# Patient Record
Sex: Male | Born: 1960 | Race: White | Hispanic: Yes | Smoking: Never smoker
Health system: Southern US, Community
[De-identification: ages and names within clinical notes are randomized; demographics above are authoritative.]

## PROBLEM LIST (undated history)

## (undated) HISTORY — PX: PARTIAL HIP ARTHROPLASTY: SHX733

---

## 2004-02-08 ENCOUNTER — Other Ambulatory Visit: Payer: Self-pay

## 2007-02-06 ENCOUNTER — Emergency Department: Payer: Self-pay | Admitting: Emergency Medicine

## 2008-01-08 ENCOUNTER — Emergency Department: Payer: Self-pay

## 2008-01-26 ENCOUNTER — Ambulatory Visit: Payer: Self-pay | Admitting: Internal Medicine

## 2008-02-02 ENCOUNTER — Ambulatory Visit: Payer: Self-pay | Admitting: Orthopedic Surgery

## 2008-02-21 ENCOUNTER — Ambulatory Visit: Payer: Self-pay | Admitting: Orthopedic Surgery

## 2008-03-02 ENCOUNTER — Inpatient Hospital Stay: Payer: Self-pay | Admitting: Orthopedic Surgery

## 2008-05-20 ENCOUNTER — Emergency Department: Payer: Self-pay | Admitting: Emergency Medicine

## 2013-08-16 ENCOUNTER — Emergency Department: Payer: Self-pay | Admitting: Emergency Medicine

## 2017-03-26 ENCOUNTER — Emergency Department
Admission: EM | Admit: 2017-03-26 | Discharge: 2017-03-26 | Disposition: A | Discharge status: Home / Self Care | Payer: PRIVATE HEALTH INSURANCE | Attending: Emergency Medicine | Admitting: Emergency Medicine

## 2017-03-26 ENCOUNTER — Emergency Department: Payer: PRIVATE HEALTH INSURANCE

## 2017-03-26 DIAGNOSIS — Y9389 Activity, other specified: Secondary | ICD-10-CM | POA: Insufficient documentation

## 2017-03-26 DIAGNOSIS — S50352A Superficial foreign body of left elbow, initial encounter: Secondary | ICD-10-CM | POA: Diagnosis present

## 2017-03-26 DIAGNOSIS — W458XXA Other foreign body or object entering through skin, initial encounter: Secondary | ICD-10-CM | POA: Diagnosis not present

## 2017-03-26 DIAGNOSIS — L03114 Cellulitis of left upper limb: Secondary | ICD-10-CM | POA: Diagnosis not present

## 2017-03-26 DIAGNOSIS — S50852A Superficial foreign body of left forearm, initial encounter: Secondary | ICD-10-CM | POA: Diagnosis not present

## 2017-03-26 DIAGNOSIS — Y929 Unspecified place or not applicable: Secondary | ICD-10-CM | POA: Insufficient documentation

## 2017-03-26 DIAGNOSIS — Y998 Other external cause status: Secondary | ICD-10-CM | POA: Diagnosis not present

## 2017-03-26 DIAGNOSIS — L089 Local infection of the skin and subcutaneous tissue, unspecified: Secondary | ICD-10-CM | POA: Diagnosis not present

## 2017-03-26 LAB — CBC WITH DIFFERENTIAL/PLATELET
BASOS ABS: 0.1 10*3/uL (ref 0–0.1)
BASOS PCT: 2 %
EOS ABS: 0 10*3/uL (ref 0–0.7)
Eosinophils Relative: 0 %
HEMATOCRIT: 44.4 % (ref 40.0–52.0)
Hemoglobin: 15.2 g/dL (ref 13.0–18.0)
Lymphocytes Relative: 38 %
Lymphs Abs: 2.5 10*3/uL (ref 1.0–3.6)
MCH: 31.8 pg (ref 26.0–34.0)
MCHC: 34.3 g/dL (ref 32.0–36.0)
MCV: 92.9 fL (ref 80.0–100.0)
MONO ABS: 0.7 10*3/uL (ref 0.2–1.0)
Monocytes Relative: 11 %
NEUTROS ABS: 3.1 10*3/uL (ref 1.4–6.5)
NEUTROS PCT: 49 %
PLATELETS: 245 10*3/uL (ref 150–440)
RBC: 4.78 MIL/uL (ref 4.40–5.90)
RDW: 12.9 % (ref 11.5–14.5)
WBC: 6.4 10*3/uL (ref 3.8–10.6)

## 2017-03-26 LAB — BASIC METABOLIC PANEL
ANION GAP: 7 (ref 5–15)
BUN: 12 mg/dL (ref 6–20)
CALCIUM: 9.1 mg/dL (ref 8.9–10.3)
CO2: 26 mmol/L (ref 22–32)
Chloride: 105 mmol/L (ref 101–111)
Creatinine, Ser: 0.85 mg/dL (ref 0.61–1.24)
GLUCOSE: 92 mg/dL (ref 65–99)
Potassium: 3.8 mmol/L (ref 3.5–5.1)
Sodium: 138 mmol/L (ref 135–145)

## 2017-03-26 MED ORDER — HYDROCODONE-ACETAMINOPHEN 5-325 MG PO TABS
1.0000 | ORAL_TABLET | Freq: Once | ORAL | Status: AC
  Administered 2017-03-26: 1 via ORAL
  Filled 2017-03-26: qty 1

## 2017-03-26 MED ORDER — CLINDAMYCIN PHOSPHATE 600 MG/50ML IV SOLN
600.0000 mg | Freq: Once | INTRAVENOUS | Status: AC
  Administered 2017-03-26: 600 mg via INTRAVENOUS
  Filled 2017-03-26: qty 50

## 2017-03-26 MED ORDER — KETOROLAC TROMETHAMINE 30 MG/ML IJ SOLN
30.0000 mg | Freq: Once | INTRAMUSCULAR | Status: AC
  Administered 2017-03-26: 30 mg via INTRAVENOUS
  Filled 2017-03-26: qty 1

## 2017-03-26 MED ORDER — HYDROCODONE-ACETAMINOPHEN 5-325 MG PO TABS: 1.0000 | ORAL_TABLET | Freq: Three times a day (TID) | ORAL | 0 refills | Status: DC | PRN

## 2017-03-26 MED ORDER — KETOROLAC TROMETHAMINE 10 MG PO TABS: 10.0000 mg | ORAL_TABLET | Freq: Three times a day (TID) | ORAL | 0 refills | Status: DC

## 2017-03-26 MED ORDER — CLINDAMYCIN HCL 300 MG PO CAPS: 300.0000 mg | ORAL_CAPSULE | Freq: Three times a day (TID) | ORAL | 0 refills | Status: AC

## 2017-03-26 MED ORDER — IBUPROFEN 800 MG PO TABS: 800.0000 mg | ORAL_TABLET | Freq: Once | ORAL | Status: DC

## 2017-03-26 NOTE — Discharge Instructions (Signed)
Keep the wound clean, dry, and covered. Follow-up with the company's urgent care center on Sunday, as scheduled. Return to the ED for signs of worsening infection. Take the antibiotic as directed. Take the anti-inflammatory pain medicine daily as directed. Take the pain medicine as needed.

## 2017-03-26 NOTE — ED Provider Notes (Signed)
[facility] Emergency Department Provider Note ____________________________________________  Time seen: 1456  I have reviewed the triage vital signs and the nursing notes.  HISTORY  Chief Complaint  Foreign Body  HPI Jimmy Homewood Sr. is a 56 y.o. male Presents to the ED from a local urgent care, after the attempt to remove a retained metallic foreign body from his left elbow. Patient scribes that a week ago he was using a dominant blade to cut some cast iron types. He describes [employer] officer flying and cutting his forearms. He describes a large piece apparently impaled his forearm. Since that time he's noted increased swelling and redness to his left forearm. He presented to a local urgent care today, for evaluation and management. They apparently attempted to remove the suspected metallic foreign body. He presents with a large surgical wound to the proximal forearm. He notes they were unable to remove any foreign body material. He notes his last tetanus was 5 year prior.   History reviewed. No pertinent past medical history.  There are no active problems to display for this patient.  Past Surgical History:  Procedure Laterality Date  . PARTIAL HIP ARTHROPLASTY Left     Prior to Admission medications   Medication Sig Start Date End Date Taking? Authorizing Provider  Omega-3 Fatty Acids (FISH OIL) 1000 MG CAPS Take by mouth.   Yes [provider]  clindamycin (CLEOCIN) 300 MG capsule Take 1 capsule (300 mg total) by mouth 3 (three) times daily. 03/26/17 04/05/17  Gershom Brobeck, Charlesetta Ivory, PA-C  HYDROcodone-acetaminophen (NORCO) 5-325 MG tablet Take 1 tablet by mouth 3 (three) times daily as needed. 03/26/17   Kobe Ofallon, Charlesetta Ivory, PA-C  ketorolac (TORADOL) 10 MG tablet Take 1 tablet (10 mg total) by mouth every 8 (eight) hours. 03/26/17   Aran Menning, Charlesetta Ivory, PA-C   Allergies Patient has no known allergies.  No family history on  file.  Social History Social History  Substance Use Topics  . Smoking status: Never Smoker  . Smokeless tobacco: Never Used  . Alcohol use Yes    Review of Systems  Constitutional: Negative for fever. Cardiovascular: Negative for chest pain. Respiratory: Negative for shortness of breath. Musculoskeletal: Negative for back pain. LUE pain & swelling as above  Skin: Negative for rash. Neurological: Negative for headaches, focal weakness or numbness. ____________________________________________  PHYSICAL EXAM:  VITAL SIGNS: ED Triage Vitals  Enc Vitals Group     BP 03/26/17 1411 (!) 162/80     Pulse Rate 03/26/17 1411 65     Resp 03/26/17 1411 16     Temp 03/26/17 1411 (!) 97.5 F (36.4 C)     Temp Source 03/26/17 1411 Oral     SpO2 03/26/17 1411 98 %     Weight 03/26/17 1412 185 lb (83.9 kg)     Height 03/26/17 1412  (1.676 m)     Head Circumference --      Peak Flow --      Pain Score 03/26/17 1411 10     Pain Loc --      Pain Edu? --      Excl. in GC? --     Constitutional: Alert and oriented. Well appearing and in no distress. Head: Normocephalic and atraumatic. Cardiovascular: Normal rate, regular rhythm. Normal distal pulses. Respiratory: Normal respiratory effort. No wheezes/rales/rhonchi. Musculoskeletal: Normal composite fist. Nontender with normal range of motion in all extremities.  Neurologic:  Normal gross sensation. Normal intrinsic &  opposition testing. No gross focal neurologic deficits are appreciated. Skin:  Skin is warm, dry and intact. No rash noted. LUE with local erythema and edema extending distally from the 4 cm stellate laceration at the elbow.  ____________________________________________   LABS (pertinent positives/negatives)  Labs Reviewed  BASIC METABOLIC PANEL  CBC WITH DIFFERENTIAL/PLATELET  ____________________________________________   RADIOLOGY  Left Elbow  IMPRESSION: No evidence of metallic density foreign  object  I, Akosua Constantine, Charlesetta Ivory, personally viewed and evaluated these images (plain radiographs) as part of my medical decision making, as well as reviewing the written report by the radiologist. ____________________________________________  PROCEDURES  Toradol 30 mg IVP Clindamycin 600 mg IVPB Norco 5-325 mg PO  LACERATION REPAIR Performed by: Lauro Regulus, PA-S ([place])  Authorized by: Lissa Hoard Consent: Verbal consent obtained. Risks and benefits: risks, benefits and alternatives were discussed Consent given by: patient Patient identity confirmed: provided demographic data Prepped and Draped in normal sterile fashion Wound explored  Laceration Location: left elbow/forearm  Laceration Length: 4 cm  No Foreign Bodies seen or palpated  Irrigation method: syringe w/ saline Amount of cleaning: copious saline flush  Skin closure: Steri-strips  Patient tolerance: Patient tolerated the procedure well with no immediate complications. ____________________________________________  INITIAL IMPRESSION / ASSESSMENT AND PLAN / ED COURSE  Patient with the ED evaluation of a left forearm cellulitis following a laceration created by a metallic foreign body, 1 week prior. Patient was seen today at the local urgent care, an attempt was made to remove a subcutaneous foreign body seen on x-ray. Repeat of the x-rays here in the ED does not reveal a metallic foreign body or radiopaque foreign body in the forearm. The patient is treated for a cellulitis to the left forearm and an IV dose of clindamycin is for infection. He'll be discharged with clindamycin tabs to dose as directed, Toradol for pain, and hydrocodone for more moderate to severe pain. Wound care instructions are provided and he will follow up with ortho in 3-5 days. Return precautions are reviewed with the patient. He is set to see the company's urgent care center on Sunday, for wound  check. ____________________________________________  FINAL CLINICAL IMPRESSION(S) / ED DIAGNOSES  Final diagnoses:  Cellulitis of left upper extremity  Foreign body in forearm-infected, left, initial encounter      Lissa Hoard, PA-C 03/26/17 1806    Rockne Menghini, MD 03/26/17 2349

## 2017-03-26 NOTE — ED Notes (Addendum)
See triage note  Presents with a piece of metal in left arm  States he was working and cutting metal a piece went into arm  Was sent from [facility]   Per pt they tried to remove it w/o success  Left f/a is swollen and tender to touch  States this happened about 2-3 days ago

## 2017-03-26 NOTE — ED Triage Notes (Signed)
Pt sent from urgent care with c/o a piece of metal in the left elbow from cutting metal pipes today. States they tried for 2 hrs to remove the foreign body without any relief.Marland Kitchen

## 2018-04-11 IMAGING — DX DG ELBOW COMPLETE 3+V*L*
4 series · 4 of 4 positions shown · non-contrast
Comparison: None.

CLINICAL DATA: Metallic foreign object after metal cutting injury.

EXAM:
LEFT ELBOW - COMPLETE 3+ VIEW

[elbow ap]
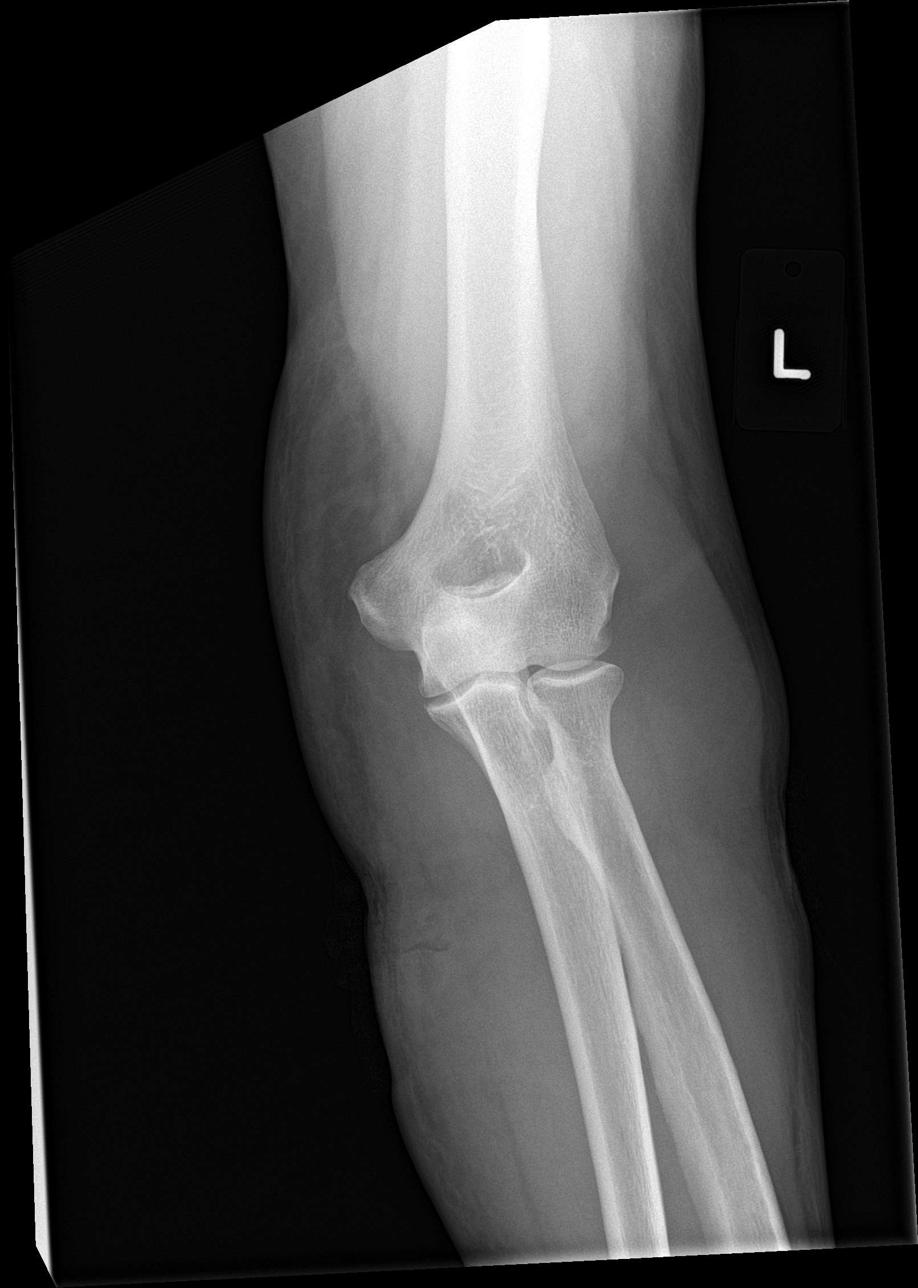

[elbow obl (1 of 2)]
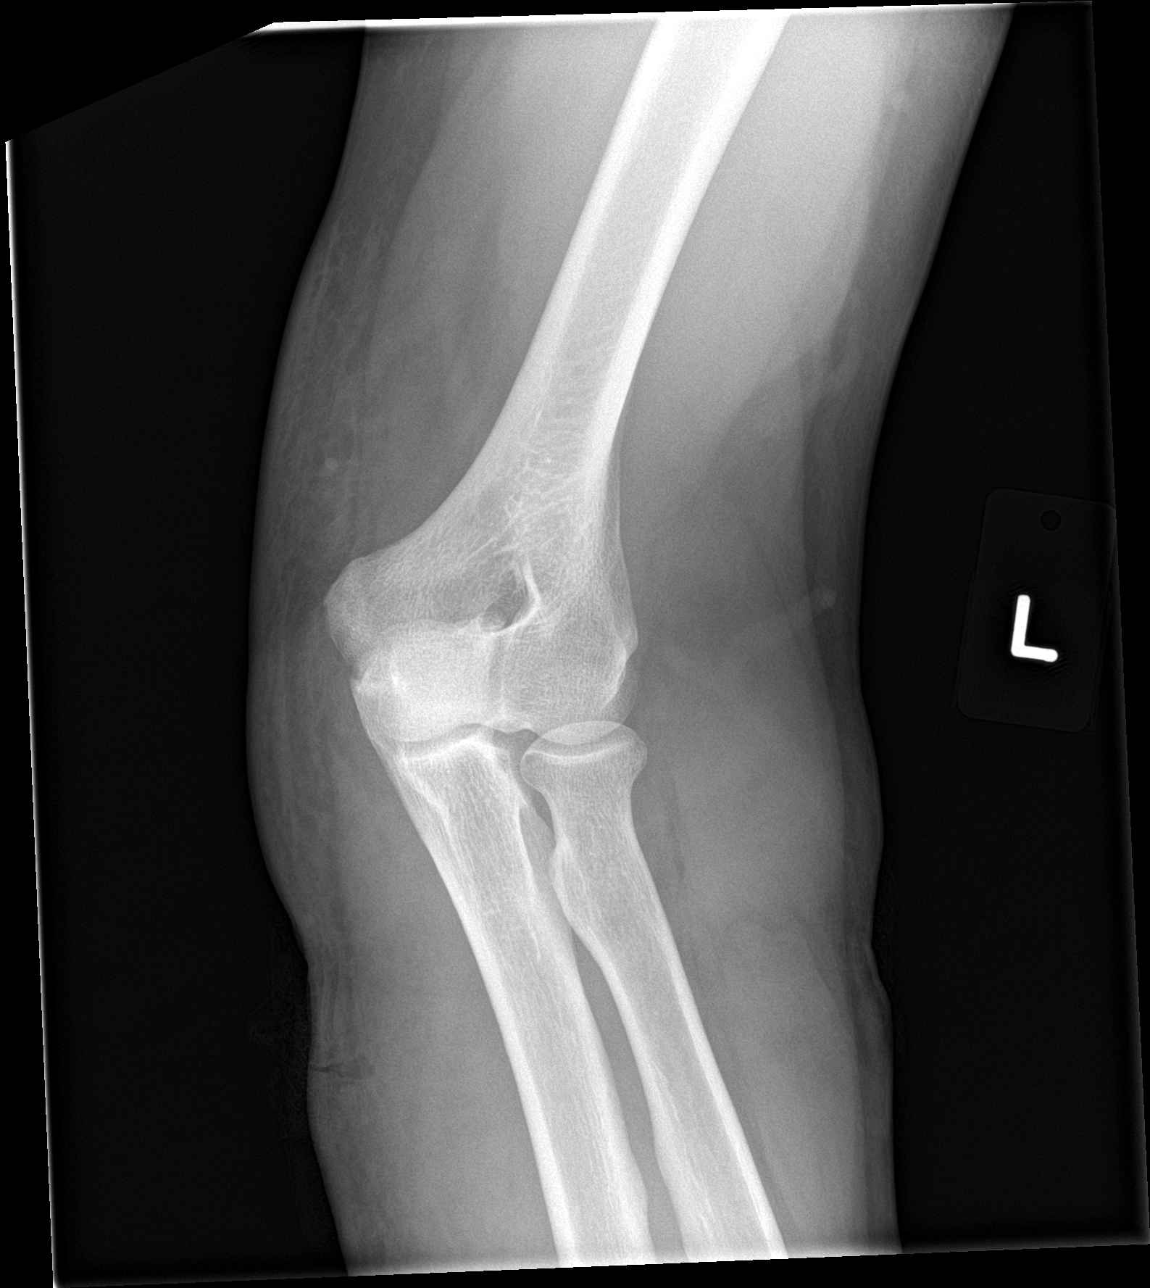

[elbow obl (2 of 2)]
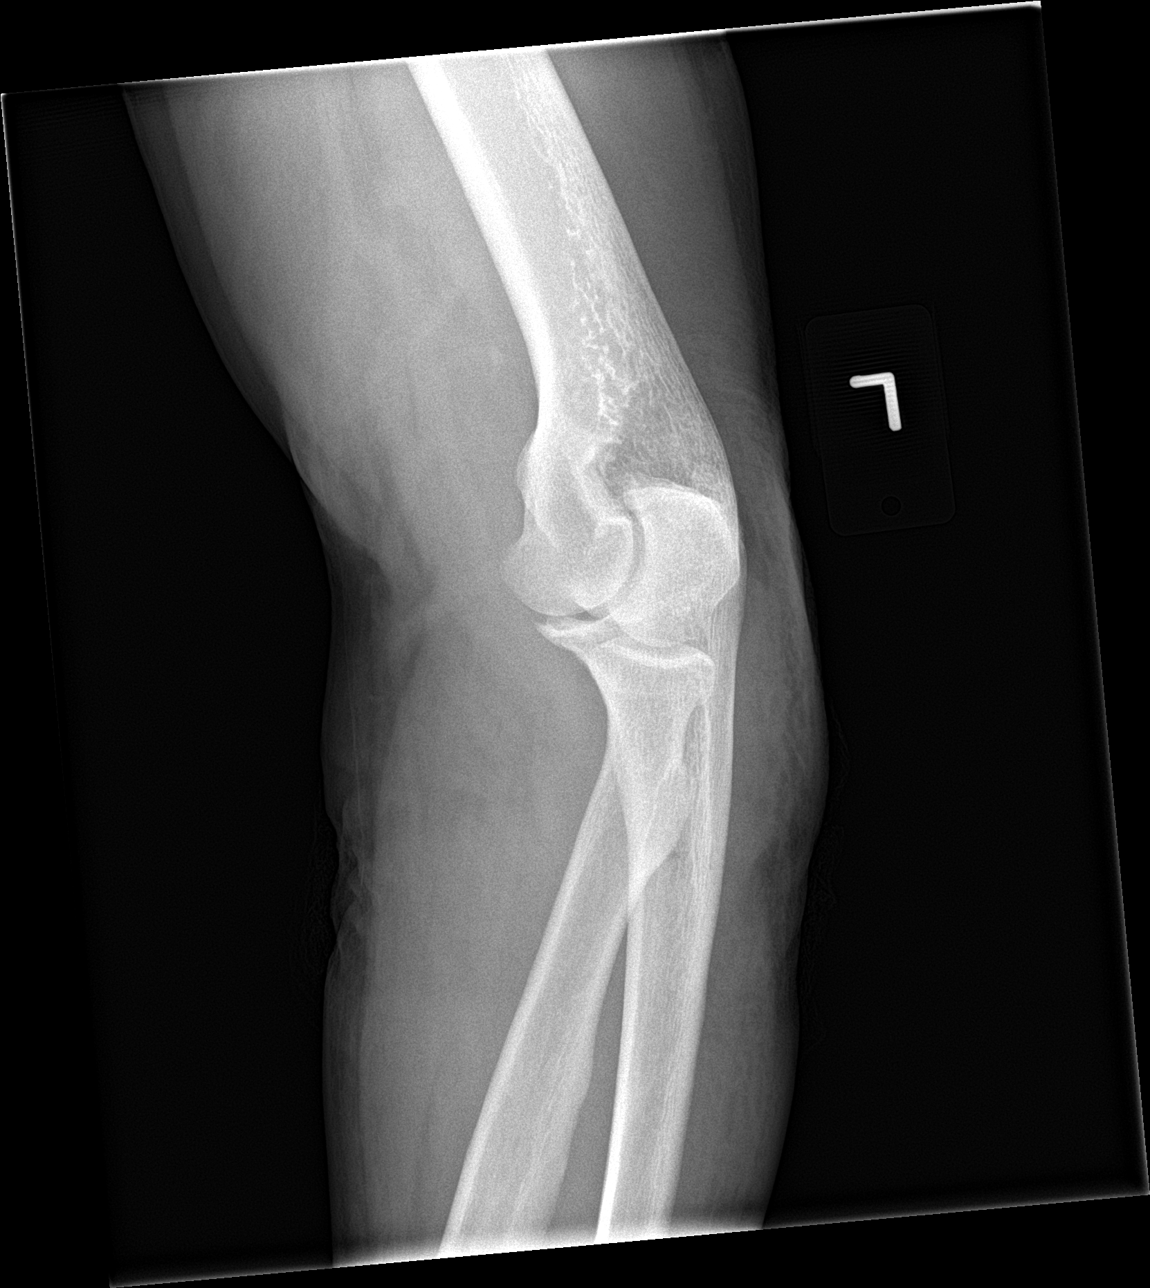

[elbow lat]
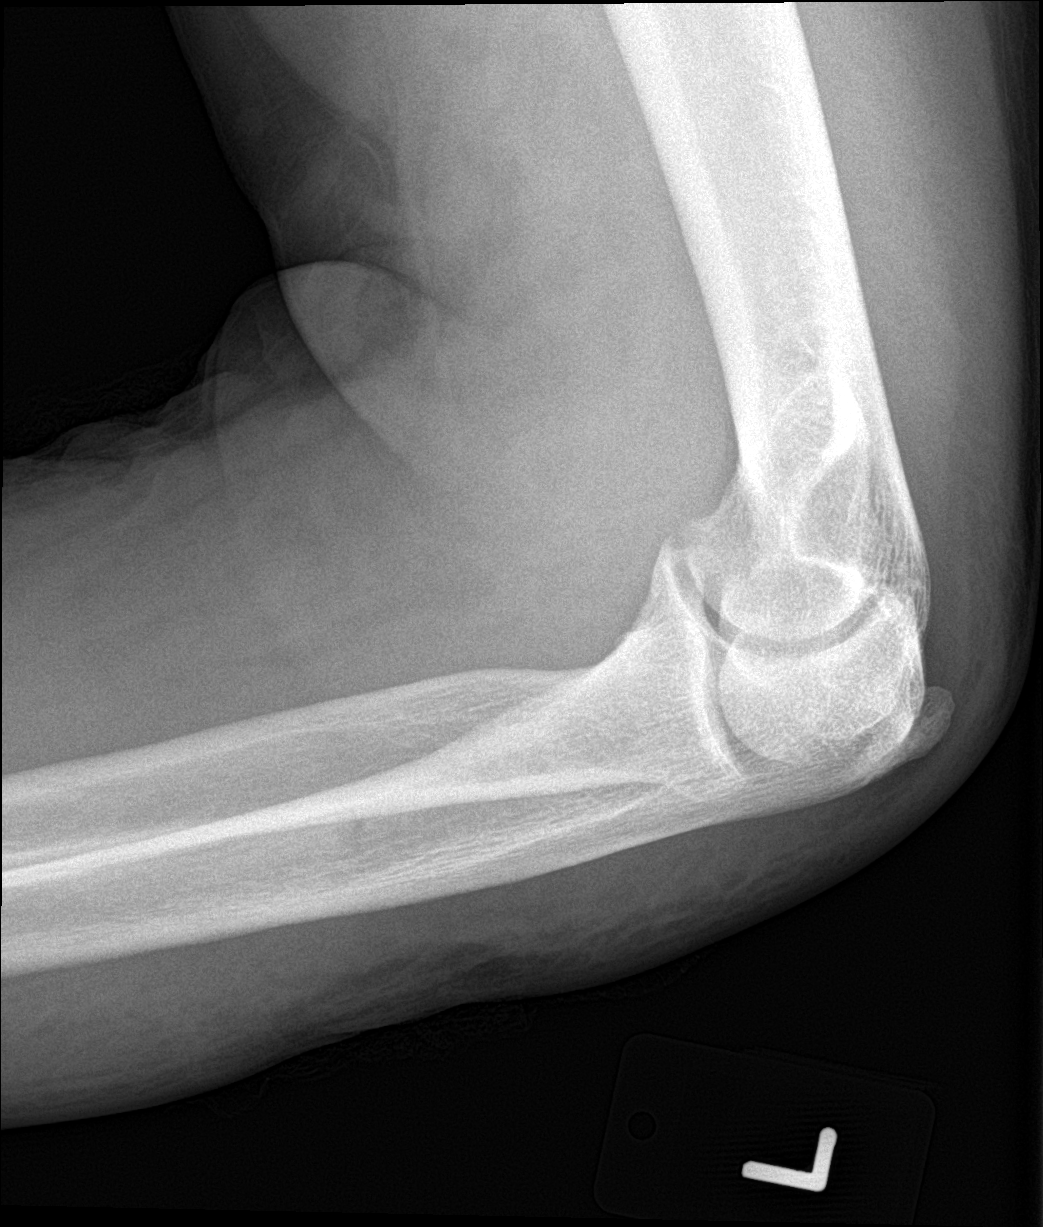

[4 of 4 positions shown; findings below may reference images not displayed]

FINDINGS: There appears to be a soft tissue injury of the medial proximal
forearm. There is no evidence of metallic density foreign object in
the region imaged. The elbow joint itself appears normal.
IMPRESSION: No evidence of metallic density foreign object.

## 2019-07-21 ENCOUNTER — Ambulatory Visit: Payer: Self-pay | Attending: Internal Medicine

## 2019-07-21 DIAGNOSIS — Z20822 Contact with and (suspected) exposure to covid-19: Secondary | ICD-10-CM

## 2019-07-21 DIAGNOSIS — U071 COVID-19: Secondary | ICD-10-CM | POA: Insufficient documentation

## 2019-07-22 LAB — NOVEL CORONAVIRUS, NAA: SARS-CoV-2, NAA: DETECTED — AB

## 2020-02-02 ENCOUNTER — Ambulatory Visit: Payer: Self-pay | Admitting: Internal Medicine

## 2020-02-06 ENCOUNTER — Other Ambulatory Visit: Payer: Self-pay

## 2020-02-06 ENCOUNTER — Ambulatory Visit (INDEPENDENT_AMBULATORY_CARE_PROVIDER_SITE_OTHER): Payer: BC Managed Care – PPO | Admitting: Internal Medicine

## 2020-02-06 ENCOUNTER — Encounter: Payer: Self-pay | Admitting: Internal Medicine

## 2020-02-06 VITALS — BP 143/94 | HR 68 | Ht 70.0 in | Wt 198.8 lb

## 2020-02-06 DIAGNOSIS — E8881 Metabolic syndrome: Secondary | ICD-10-CM | POA: Insufficient documentation

## 2020-02-06 DIAGNOSIS — J45909 Unspecified asthma, uncomplicated: Secondary | ICD-10-CM

## 2020-02-06 DIAGNOSIS — J208 Acute bronchitis due to other specified organisms: Secondary | ICD-10-CM | POA: Insufficient documentation

## 2020-02-06 NOTE — Assessment & Plan Note (Signed)
-   I encouraged the patient to lose weight.  - I educated them on making healthy dietary choices including eating more fruits and vegetables and less fried foods. - I encouraged the patient to exercise more, and educated on the benefits of exercise including weight loss, diabetes prevention, and hypertension prevention.   

## 2020-02-06 NOTE — Assessment & Plan Note (Signed)
He has hyperactive airway disease. No rhonchi was heard on today's examination.

## 2020-02-06 NOTE — Assessment & Plan Note (Signed)
The patient was seen in the [facility] ED for near syncope. His EKG was normal. CPK was normal. Metabolic panel was within normal limits. Troponin was normal. He is doing well today without any chest pain or shortness of breath. His chest x-ray in the hospital was unremarkable. I believe that he can return to work safely. He needs to complete his course of Doxycycline. He was advised to wear a hat and shirt while taking the medication and working outside.

## 2020-02-06 NOTE — Progress Notes (Signed)
New Patient Office Visit  Subjective:  Subjective  Patient ID: Jimmy Richer Sr., male    DOB: 06-06-1961  Age: 59 y.o. MRN: [id number]  CC:  Chief Complaint  Patient presents with  . New Patient (Initial Visit)    HPI Jimmy Petro Sr. is a 59 y.o. male presenting today to establish care as a new patient. He requests clearance to go back to work.  The patient states that he was bitten by a tick on his back on 01/23/2020 and began experiencing dizziness, fever, chills, weakness, and rash the next day. He presented to the ED at [facility] on 01/29/2020 and had a full work-up completed. Labs, UA, and chest x-ray were reassuring. COVID test was negative. He was prescribed Doxycycline in the event that his symptoms were related to a tick-borne illness. He is not symptomatic today and has no complaints at this time.  He denies smoking cigarettes, chewing tobacco, vaping, and drinking alcohol.   History reviewed. No pertinent past medical history.  Past Surgical History:  Procedure Laterality Date  . PARTIAL HIP ARTHROPLASTY Left     History reviewed. No pertinent family history.  Social History   Socioeconomic History  . Marital status: Divorced    Spouse name: Not on file  . Number of children: Not on file  . Years of education: Not on file  . Highest education level: Not on file  Occupational History  . Not on file  Tobacco Use  . Smoking status: Never Smoker  . Smokeless tobacco: Never Used  Substance and Sexual Activity  . Alcohol use: Yes  . Drug use: No  . Sexual activity: Not Currently  Other Topics Concern  . Not on file  Social History Narrative  . Not on file   Social Determinants of Health   Financial Resource Strain:   . Difficulty of Paying Living Expenses:   Food Insecurity:   . Worried About Programme researcher, broadcasting/film/video in the Last Year:   . Barista in the Last Year:   Transportation Needs:   . Freight forwarder (Medical):   Marland Kitchen Lack  of Transportation (Non-Medical):   Physical Activity:   . Days of Exercise per Week:   . Minutes of Exercise per Session:   Stress:   . Feeling of Stress :   Social Connections:   . Frequency of Communication with Friends and Family:   . Frequency of Social Gatherings with Friends and Family:   . Attends Religious Services:   . Active Member of Clubs or Organizations:   . Attends Banker Meetings:   Marland Kitchen Marital Status:   Intimate Partner Violence:   . Fear of Current or Ex-Partner:   . Emotionally Abused:   Marland Kitchen Physically Abused:   . Sexually Abused:      Current Outpatient Medications:  .  doxycycline (DORYX) 100 MG EC tablet, Take 100 mg by mouth 2 (two) times daily., Disp: , Rfl:  .  Omega-3 Fatty Acids (FISH OIL) 1000 MG CAPS, Take by mouth., Disp: , Rfl:    No Known Allergies  ROS Review of Systems  Constitutional: Negative.   HENT: Negative.   Eyes: Negative.   Respiratory: Negative.   Cardiovascular: Negative.   Gastrointestinal: Negative.   Endocrine: Negative.   Genitourinary: Negative.   Musculoskeletal: Negative.   Skin: Negative.   Allergic/Immunologic: Negative.   Neurological: Negative.   Hematological: Negative.   Psychiatric/Behavioral: Negative.   All other systems reviewed and  are negative.     Objective:    Physical Exam Vitals reviewed.  Constitutional:      Appearance: Normal appearance.  HENT:     Mouth/Throat:     Mouth: Mucous membranes are moist.  Eyes:     Pupils: Pupils are equal, round, and reactive to light.  Neck:     Vascular: No carotid bruit.  Cardiovascular:     Rate and Rhythm: Normal rate and regular rhythm.     Pulses: Normal pulses.     Heart sounds: Normal heart sounds.  Pulmonary:     Effort: Pulmonary effort is normal.     Breath sounds: Normal breath sounds.  Abdominal:     General: Bowel sounds are normal.     Palpations: Abdomen is soft. There is no hepatomegaly, splenomegaly or mass.      Tenderness: There is no abdominal tenderness.     Hernia: No hernia is present.  Musculoskeletal:     Cervical back: Neck supple.     Right lower leg: No edema.     Left lower leg: No edema.  Skin:    Findings: No rash.  Neurological:     Mental Status: He is alert and oriented to person, place, and time.     Motor: No weakness.  Psychiatric:        Mood and Affect: Mood normal.        Behavior: Behavior normal.     BP (!) 143/94   Pulse 68   Ht 5\' 10"  (1.778 m)   Wt 198 lb 12.8 oz (90.2 kg)   BMI 28.52 kg/m  Wt Readings from Last 3 Encounters:  02/06/20 198 lb 12.8 oz (90.2 kg)  03/26/17 185 lb (83.9 kg)    Health Maintenance Due  Topic Date Due  . Hepatitis C Screening  Never done  . COVID-19 Vaccine (1) Never done  . HIV Screening  Never done  . TETANUS/TDAP  Never done  . COLONOSCOPY  Never done  . INFLUENZA VACCINE  02/05/2020    There are no preventive care reminders to display for this patient.  Laboratory Data: I have reviewed this information for accuracy. CBC Latest Ref Rng & Units 03/26/2017  WBC 3.8 - 10.6 K/uL 6.4  Hemoglobin 13.0 - 18.0 g/dL 03/28/2017  Hematocrit 40 - 52 % 44.4  Platelets 150 - 440 K/uL 245   CMP Latest Ref Rng & Units 03/26/2017  Glucose 65 - 99 mg/dL 92  BUN 6 - 20 mg/dL 12  Creatinine 03/28/2017 - 7.79 mg/dL 3.90  Sodium 3.00 - 923 mmol/L 138  Potassium 3.5 - 5.1 mmol/L 3.8  Chloride 101 - 111 mmol/L 105  CO2 22 - 32 mmol/L 26  Calcium 8.9 - 10.3 mg/dL 9.1    No results found for: TSH Lab Results  Component Value Date   ANIONGAP 7 03/26/2017   No results found for: CHOL, HDL, LDLCALC, CHOLHDL No results found for: TRIG No results found for: HGBA1C    Assessment & Plan:   Problem List Items Addressed This Visit      Respiratory   Viral bronchitis - Primary    The patient was seen in the [facility] ED for near syncope. His EKG was normal. CPK was normal. Metabolic panel was within normal limits. Troponin was normal. He is  doing well today without any chest pain or shortness of breath. His chest x-ray in the hospital was unremarkable. I believe that he can return to work safely. He  needs to complete his course of Doxycycline. He was advised to wear a hat and shirt while taking the medication and working outside.      Mild asthma without complication    He has hyperactive airway disease. No rhonchi was heard on today's examination.         Other   Metabolic syndrome    - I encouraged the patient to lose weight.  - I educated them on making healthy dietary choices including eating more fruits and vegetables and less fried foods. - I encouraged the patient to exercise more, and educated on the benefits of exercise including weight loss, diabetes prevention, and hypertension prevention.           @ENCMED @   Follow-up: Return in about 3 months (around 05/08/2020).    Dr. 13/08/2019 [facility] [address], [place], [place]   By signing my name below, I, [id number], attest that this documentation has been prepared under the direction and in the presence of Nikki Dom, MD. Electronically Signed: Corky Downs, MD 02/06/20, 3:08 PM   I personally performed the services described in this documentation, which was SCRIBED in my presence. The recorded information has been reviewed and considered accurate. It has been edited as necessary during review. 04/07/20, MD

## 2021-01-07 ENCOUNTER — Encounter: Payer: Self-pay | Admitting: Emergency Medicine

## 2021-01-07 ENCOUNTER — Other Ambulatory Visit: Payer: Self-pay

## 2021-01-07 ENCOUNTER — Emergency Department
Admission: EM | Admit: 2021-01-07 | Discharge: 2021-01-07 | Disposition: A | Discharge status: Home / Self Care | Payer: BC Managed Care – PPO | Attending: Emergency Medicine | Admitting: Emergency Medicine

## 2021-01-07 DIAGNOSIS — L03311 Cellulitis of abdominal wall: Secondary | ICD-10-CM

## 2021-01-07 DIAGNOSIS — Y92009 Unspecified place in unspecified non-institutional (private) residence as the place of occurrence of the external cause: Secondary | ICD-10-CM | POA: Insufficient documentation

## 2021-01-07 DIAGNOSIS — S30861A Insect bite (nonvenomous) of abdominal wall, initial encounter: Secondary | ICD-10-CM | POA: Insufficient documentation

## 2021-01-07 DIAGNOSIS — W57XXXA Bitten or stung by nonvenomous insect and other nonvenomous arthropods, initial encounter: Secondary | ICD-10-CM | POA: Insufficient documentation

## 2021-01-07 DIAGNOSIS — Z96642 Presence of left artificial hip joint: Secondary | ICD-10-CM | POA: Insufficient documentation

## 2021-01-07 MED ORDER — SULFAMETHOXAZOLE-TRIMETHOPRIM 800-160 MG PO TABS: 1.0000 | ORAL_TABLET | Freq: Two times a day (BID) | ORAL | 0 refills | Status: AC

## 2021-01-07 MED ORDER — CEPHALEXIN 500 MG PO CAPS: 500.0000 mg | ORAL_CAPSULE | Freq: Three times a day (TID) | ORAL | 0 refills | Status: AC

## 2021-01-07 NOTE — ED Notes (Signed)
See triage note  Presents with possible insect bite to lower abd  States he felt something on Saturday but developed increased redness and swelling this am

## 2021-01-07 NOTE — ED Provider Notes (Signed)
[facility] Emergency Department Provider Note  ____________________________________________  Time seen: Approximately 11:21 AM  I have reviewed the triage vital signs and the nursing notes.   HISTORY  Chief Complaint Insect Bite    HPI Jimmy Bukhari Sr. is a 60 y.o. male with no significant past medical history who comes the ED complaining of right lower abdominal wall swelling and pain that started gradually over the past 36 hours ever since having a spider bite at home.  He notes that it was a green spider, not black widow or brown recluse.  No other pain, no headache or fever.  No vomiting or diarrhea or paresthesias.  Just feels fatigued.  Symptoms are constant, no aggravating or alleviating factors.  Pain is nonradiating and mild to moderate intensity.    History reviewed. No pertinent past medical history.   Patient Active Problem List   Diagnosis Date Noted   Viral bronchitis 02/06/2020   Metabolic syndrome 02/06/2020   Mild asthma without complication 02/06/2020     Past Surgical History:  Procedure Laterality Date   PARTIAL HIP ARTHROPLASTY Left      Prior to Admission medications   Medication Sig Start Date End Date Taking? Authorizing Provider  cephALEXin (KEFLEX) 500 MG capsule Take 1 capsule (500 mg total) by mouth 3 (three) times daily for 10 days. 01/07/21 01/17/21 Yes Sharman Cheek, MD  sulfamethoxazole-trimethoprim (BACTRIM DS) 800-160 MG tablet Take 1 tablet by mouth 2 (two) times daily for 10 days. 01/07/21 01/17/21 Yes Sharman Cheek, MD  Omega-3 Fatty Acids (FISH OIL) 1000 MG CAPS Take by mouth.    [provider]     Allergies Patient has no known allergies.   History reviewed. No pertinent family history.  Social History Social History   Tobacco Use   Smoking status: Never   Smokeless tobacco: Never  Substance Use Topics   Alcohol use: Yes   Drug use: No    Review of Systems  Constitutional:    No fever or chills.  ENT:   No sore throat. No rhinorrhea. Cardiovascular:   No chest pain or syncope. Respiratory:   No dyspnea or cough. Gastrointestinal:   Negative for abdominal pain, vomiting and diarrhea.  Musculoskeletal:   Right lower abdominal wall pain All other systems reviewed and are negative except as documented above in ROS and HPI.  ____________________________________________   PHYSICAL EXAM:  VITAL SIGNS: ED Triage Vitals  Enc Vitals Group     BP 01/07/21 1010 (!) 146/71     Pulse Rate 01/07/21 1010 66     Resp 01/07/21 1010 17     Temp 01/07/21 1010 97.8 F (36.6 C)     Temp Source 01/07/21 1010 Oral     SpO2 01/07/21 1010 96 %     Weight 01/07/21 1010 205 lb (93 kg)     Height 01/07/21 1010 5\' 6"  (1.676 m)     Head Circumference --      Peak Flow --      Pain Score 01/07/21 1010 9     Pain Loc --      Pain Edu? --      Excl. in GC? --     Vital signs reviewed, nursing assessments reviewed.   Constitutional:   Alert and oriented. Non-toxic appearance. Eyes:   Conjunctivae are normal. EOMI. ENT      Head:   Normocephalic and atraumatic.      Mouth/Throat:   MMM      Neck:  No meningismus. Full ROM. Hematological/Lymphatic/Immunilogical:   No inguinal lymphadenopathy. Cardiovascular:   RRR.Cap refill less than 2 seconds. Respiratory:   Normal respiratory effort without tachypnea/retractions.  Gastrointestinal:   Soft and nontender. Non distended..  No rebound, rigidity, or guarding. Musculoskeletal:   Normal range of motion in all extremities.  No edema. There is 3 x 5 cm area of cellulitis of the right lower abdominal wall with induration warmth erythema and tenderness.  No fluctuance or drainage.  There is a central area of ecchymosis from likely insect bite.  No crepitus.  No eschar. Neurologic:   Normal speech and language.  Motor grossly intact. No acute focal neurologic deficits are appreciated.  Skin:    Skin is warm, dry with abdominal  wall cellulitis as above.  No other inflammatory changes.  No rash noted.  No wounds.  ____________________________________________    LABS (pertinent positives/negatives) (all labs ordered are listed, but only abnormal results are displayed) Labs Reviewed - No data to display ____________________________________________   EKG  ____________________________________________    RADIOLOGY  No results found.  ____________________________________________   PROCEDURES Procedures  ____________________________________________  CLINICAL IMPRESSION / ASSESSMENT AND PLAN / ED COURSE  Pertinent labs & imaging results that were available during my care of the patient were reviewed by me and considered in my medical decision making (see chart for details).  Jimmy Richer Sr. was evaluated in Emergency Department on 01/07/2021 for the symptoms described in the history of present illness. He was evaluated in the context of the global COVID-19 pandemic, which necessitated consideration that the patient might be at risk for infection with the SARS-CoV-2 virus that causes COVID-19. Institutional protocols and algorithms that pertain to the evaluation of patients at risk for COVID-19 are in a state of rapid change based on information released by regulatory bodies including the CDC and federal and state organizations. These policies and algorithms were followed during the patient's care in the ED.   Patient presents with right lower abdominal wall cellulitis, fairly limited in size.  Patient is nontoxic without other comorbidities.  Will treat with Bactrim and Keflex, stable for discharge      ____________________________________________   FINAL CLINICAL IMPRESSION(S) / ED DIAGNOSES    Final diagnoses:  Insect bite of abdominal wall, initial encounter  Abdominal wall cellulitis     ED Discharge Orders          Ordered    cephALEXin (KEFLEX) 500 MG capsule  3 times daily         01/07/21 1121    sulfamethoxazole-trimethoprim (BACTRIM DS) 800-160 MG tablet  2 times daily        01/07/21 1121            Portions of this note were generated with dragon dictation software. Dictation errors may occur despite best attempts at proofreading.   Sharman Cheek, MD 01/07/21 1124

## 2021-01-07 NOTE — ED Triage Notes (Signed)
Pt via POV from home. Pt states he thinks a green little spider bit him on Sat evening. Pt has a insect bite to his RLQ abd near his naval. Pt states it is more red and swollen. Pt is A&Ox4 and NAD.

## 2024-04-03 ENCOUNTER — Emergency Department
Admission: EM | Admit: 2024-04-03 | Discharge: 2024-04-03 | Disposition: A | Discharge status: Home / Self Care | Attending: Emergency Medicine | Admitting: Emergency Medicine

## 2024-04-03 ENCOUNTER — Other Ambulatory Visit: Payer: Self-pay

## 2024-04-03 ENCOUNTER — Encounter: Payer: Self-pay | Admitting: Emergency Medicine

## 2024-04-03 DIAGNOSIS — G8918 Other acute postprocedural pain: Secondary | ICD-10-CM | POA: Diagnosis present

## 2024-04-03 DIAGNOSIS — M542 Cervicalgia: Secondary | ICD-10-CM | POA: Diagnosis not present

## 2024-04-03 LAB — CBC WITH DIFFERENTIAL/PLATELET
Abs Immature Granulocytes: 0.04 K/uL (ref 0.00–0.07)
Basophils Absolute: 0.1 K/uL (ref 0.0–0.1)
Basophils Relative: 1 %
Eosinophils Absolute: 0.2 K/uL (ref 0.0–0.5)
Eosinophils Relative: 2 %
HCT: 43.8 % (ref 39.0–52.0)
Hemoglobin: 15.6 g/dL (ref 13.0–17.0)
Immature Granulocytes: 1 %
Lymphocytes Relative: 36 %
Lymphs Abs: 2.5 K/uL (ref 0.7–4.0)
MCH: 31.8 pg (ref 26.0–34.0)
MCHC: 35.6 g/dL (ref 30.0–36.0)
MCV: 89.4 fL (ref 80.0–100.0)
Monocytes Absolute: 0.8 K/uL (ref 0.1–1.0)
Monocytes Relative: 12 %
Neutro Abs: 3.4 K/uL (ref 1.7–7.7)
Neutrophils Relative %: 48 %
Platelets: 292 K/uL (ref 150–400)
RBC: 4.9 MIL/uL (ref 4.22–5.81)
RDW: 12.4 % (ref 11.5–15.5)
WBC: 7.1 K/uL (ref 4.0–10.5)
nRBC: 0 % (ref 0.0–0.2)

## 2024-04-03 LAB — COMPREHENSIVE METABOLIC PANEL WITH GFR
ALT: 35 U/L (ref 0–44)
AST: 48 U/L — ABNORMAL HIGH (ref 15–41)
Albumin: 3.7 g/dL (ref 3.5–5.0)
Alkaline Phosphatase: 85 U/L (ref 38–126)
Anion gap: 9 (ref 5–15)
BUN: 20 mg/dL (ref 8–23)
CO2: 21 mmol/L — ABNORMAL LOW (ref 22–32)
Calcium: 8.9 mg/dL (ref 8.9–10.3)
Chloride: 100 mmol/L (ref 98–111)
Creatinine, Ser: 0.91 mg/dL (ref 0.61–1.24)
GFR, Estimated: >60 mL/min (ref >60)
Glucose, Bld: 101 mg/dL — ABNORMAL HIGH (ref 70–99)
Potassium: 4.3 mmol/L (ref 3.5–5.1)
Sodium: 130 mmol/L — ABNORMAL LOW (ref 135–145)
Total Bilirubin: 1.6 mg/dL — ABNORMAL HIGH (ref 0.0–1.2)
Total Protein: 8.9 g/dL — ABNORMAL HIGH (ref 6.5–8.1)

## 2024-04-03 MED ORDER — OXYCODONE HCL 5 MG PO TABS: 10.0000 mg | ORAL_TABLET | Freq: Three times a day (TID) | ORAL | 0 refills | Status: AC | PRN

## 2024-04-03 MED ORDER — HYDROMORPHONE HCL 1 MG/ML IJ SOLN
1.0000 mg | Freq: Once | INTRAMUSCULAR | Status: AC
  Administered 2024-04-03: 1 mg via INTRAMUSCULAR
  Filled 2024-04-03: qty 1

## 2024-04-03 MED ORDER — ONDANSETRON 4 MG PO TBDP
4.0000 mg | ORAL_TABLET | Freq: Once | ORAL | Status: AC
  Administered 2024-04-03: 4 mg via ORAL
  Filled 2024-04-03: qty 1

## 2024-04-03 MED ORDER — CYCLOBENZAPRINE HCL 10 MG PO TABS: 10.0000 mg | ORAL_TABLET | Freq: Three times a day (TID) | ORAL | 0 refills | Status: AC | PRN

## 2024-04-03 NOTE — ED Triage Notes (Signed)
 Patient to ED via POV for neck pain- had surgery on Tuesday. Has pain meds at home but not helping. [facility] told to come to ED to r/o blood clot.   Blue top sent to lab if needed.

## 2024-04-03 NOTE — ED Provider Notes (Signed)
 [facility] Provider Note    Event Date/Time   First MD Initiated Contact with Patient 04/03/24 1232     (approximate)   History   Post-op Problem   HPI  Jimmy Acuna Sr. is a 63 y.o. male with history of recent C-spine surgery presents emergency department complaining of increased postop pain.  States his medications at home are not helping.  States he is half-asleep with a neck brace and he feels like it is pulling on his neck and shoulders.  Denies chest pain/shortness of breath.  No swelling in lower extremities.  Was sent here by [facility] for recheck.  His wife states they only called because they needed pain medicine.  That they did not feel like he had a blood clot or any problems breathing.      Physical Exam   Triage Vital Signs: ED Triage Vitals  Encounter Vitals Group     BP 04/03/24 1213 130/80     Girls Systolic BP Percentile --      Girls Diastolic BP Percentile --      Boys Systolic BP Percentile --      Boys Diastolic BP Percentile --      Pulse Rate 04/03/24 1213 78     Resp 04/03/24 1213 17     Temp 04/03/24 1213 98 F (36.7 C)     Temp Source 04/03/24 1213 Oral     SpO2 04/03/24 1213 95 %     Weight 04/03/24 1211 205 lb 0.4 oz (93 kg)     Height 04/03/24 1211 5' 6 (1.676 m)     Head Circumference --      Peak Flow --      Pain Score 04/03/24 1211 10     Pain Loc --      Pain Education --      Exclude from Growth Chart --     Most recent vital signs: Vitals:   04/03/24 1213  BP: 130/80  Pulse: 78  Resp: 17  Temp: 98 F (36.7 C)  SpO2: 95%     General: Awake, no distress.   CV:  Good peripheral perfusion. regular rate and  rhythm Resp:  Normal effort. Lungs cta Abd:  No distention.   Other:      ED Results / Procedures / Treatments   Labs (all labs ordered are listed, but only abnormal results are displayed) Labs Reviewed  COMPREHENSIVE METABOLIC PANEL WITH GFR - Abnormal; Notable for the  following components:      Result Value   Sodium 130 (*)    CO2 21 (*)    Glucose, Bld 101 (*)    Total Protein 8.9 (*)    AST 48 (*)    Total Bilirubin 1.6 (*)    All other components within normal limits  CBC WITH DIFFERENTIAL/PLATELET     EKG     RADIOLOGY     PROCEDURES:   Procedures  Critical Care:  no Chief Complaint  Patient presents with   Post-op Problem      MEDICATIONS ORDERED IN ED: Medications  HYDROmorphone (DILAUDID) injection 1 mg (1 mg Intramuscular Given 04/03/24 1337)  ondansetron (ZOFRAN-ODT) disintegrating tablet 4 mg (4 mg Oral Given 04/03/24 1337)     IMPRESSION / MDM / ASSESSMENT AND PLAN / ED COURSE  I reviewed the triage vital signs and the nursing notes.  Differential diagnosis includes, but is not limited to, postop pain, muscle spasm, cervical radiculopathy  Patient's presentation is most consistent with acute illness / injury with system symptoms.    Medications given Dilaudid 1 mg IM  Patient's labs are reassuring  I did discuss the findings with patient.  Dr. Dorothyann in to see the patient.  At this time we do not feel that a CTA for PE is needed.  Patient has no complaints to indicate a blood clot.  Basically he wants pain medicine because his pain medicine at home is not helping.   Patient had relief with the Dilaudid.  Vitals have remained normal, he is not tachycardic, is not having chest pain to lead to concerns or red flags for PE.  Will go ahead and increase his oxycodone to 10 mg every 8 hours.  He was also given a muscle relaxer Flexeril.  Patient is in agreement treatment plan.  Strict instructions to return for worsening.  Discharged stable condition.   FINAL CLINICAL IMPRESSION(S) / ED DIAGNOSES   Final diagnoses:  Post-operative pain     Rx / DC Orders   ED Discharge Orders          Ordered    oxyCODONE (ROXICODONE) 5 MG immediate release tablet  Every 8 hours PRN         04/03/24 1420    cyclobenzaprine (FLEXERIL) 10 MG tablet  3 times daily PRN        04/03/24 1422             Note:  This document was prepared using Dragon voice recognition software and may include unintentional dictation errors.    Gasper Devere ORN, PA-C 04/03/24 1424    Dorothyann Drivers, MD 04/03/24 [phone]

## 2024-04-03 NOTE — ED Notes (Signed)
 See triage note  Presents with pain to neck   States he had surgery last Tuesday  States pain became increased yesterday  No relief with pain meds  Denies any SOB ,or fever

## 2024-04-03 NOTE — ED Provider Notes (Signed)
-----------------------------------------   1:21 PM on 04/03/2024 ----------------------------------------- I have personally seen and evaluated the patient in conjunction with the physician assistant.  Patient is here for worsening pain in his neck.  Patient had a cervical fusion done on Tuesday (5 days ago).  Patient states since the surgery he has had progressive worsening of the pai.  He believes the pain is coming from trying to sleep in the [place] neck collar.  Denies any weakness or numbness of any arm or leg.  Denies any chest pain or shortness of breath.  No pleuritic pain.  Patient states the oxycodone was working for his pain but over the last day or so he has had increased pain that has not completely resolved with oxycodone.  Patient called his doctor's office and they advised him to come to the emergency department for evaluation.  Patient's workup today so far is reassuring with a normal CBC with a normal white blood cell count reassuring chemistry.  We will dose IM pain medication and reassess.  I suspect that this is gena be more musculoskeletal type pain.  Given no neurological deficits or other concerning findings on exam as long as the patient's pain is more adequately controlled with the pain medication I believe the patient could be discharged home possibly adding a muscle relaxer to the patient's pain medication and have him follow-up with his doctor on Monday.   Dorothyann Drivers, MD 04/03/24 1323

## 2024-04-03 NOTE — Discharge Instructions (Signed)
 While you are taking the narcotic pain medication please be sure to take a stool softener such as Colace, Senokot, or MiraLAX to prevent constipation  Follow-up with your surgeon.  Please call for appointment.  Your vitals were normal today, we do not feel that you exhibit any concerns for a blood clot today.  However if you are worsening you should return emergency department for reevaluation.

## 2024-07-28 ENCOUNTER — Emergency Department
Admission: EM | Admit: 2024-07-28 | Discharge: 2024-07-28 | Disposition: A | Discharge status: Home / Self Care | Source: Ambulatory Visit

## 2024-07-28 ENCOUNTER — Other Ambulatory Visit: Payer: Self-pay

## 2024-07-28 ENCOUNTER — Encounter: Payer: Self-pay | Admitting: Intensive Care

## 2024-07-28 ENCOUNTER — Emergency Department

## 2024-07-28 DIAGNOSIS — R109 Unspecified abdominal pain: Secondary | ICD-10-CM | POA: Diagnosis present

## 2024-07-28 DIAGNOSIS — K922 Gastrointestinal hemorrhage, unspecified: Secondary | ICD-10-CM | POA: Diagnosis not present

## 2024-07-28 LAB — COMPREHENSIVE METABOLIC PANEL WITH GFR
ALT: 28 U/L (ref 0–44)
AST: 52 U/L — ABNORMAL HIGH (ref 15–41)
Albumin: 3.6 g/dL (ref 3.5–5.0)
Alkaline Phosphatase: 101 U/L (ref 38–126)
Anion gap: 10 (ref 5–15)
BUN: 8 mg/dL (ref 8–23)
CO2: 23 mmol/L (ref 22–32)
Calcium: 8.9 mg/dL (ref 8.9–10.3)
Chloride: 105 mmol/L (ref 98–111)
Creatinine, Ser: 0.72 mg/dL (ref 0.61–1.24)
GFR, Estimated: >60 mL/min
Glucose, Bld: 90 mg/dL (ref 70–99)
Potassium: 4 mmol/L (ref 3.5–5.1)
Sodium: 138 mmol/L (ref 135–145)
Total Bilirubin: 0.5 mg/dL (ref 0.0–1.2)
Total Protein: 8.2 g/dL — ABNORMAL HIGH (ref 6.5–8.1)

## 2024-07-28 LAB — CBC WITH DIFFERENTIAL/PLATELET
Abs Immature Granulocytes: 0.01 K/uL (ref 0.00–0.07)
Basophils Absolute: 0.1 K/uL (ref 0.0–0.1)
Basophils Relative: 2 %
Eosinophils Absolute: 0.2 K/uL (ref 0.0–0.5)
Eosinophils Relative: 3 %
HCT: 43 % (ref 39.0–52.0)
Hemoglobin: 15 g/dL (ref 13.0–17.0)
Immature Granulocytes: 0 %
Lymphocytes Relative: 54 %
Lymphs Abs: 2.4 K/uL (ref 0.7–4.0)
MCH: 31.1 pg (ref 26.0–34.0)
MCHC: 34.9 g/dL (ref 30.0–36.0)
MCV: 89.2 fL (ref 80.0–100.0)
Monocytes Absolute: 0.7 K/uL (ref 0.1–1.0)
Monocytes Relative: 16 %
Neutro Abs: 1.2 K/uL — ABNORMAL LOW (ref 1.7–7.7)
Neutrophils Relative %: 25 %
Platelets: 224 K/uL (ref 150–400)
RBC: 4.82 MIL/uL (ref 4.22–5.81)
RDW: 12.7 % (ref 11.5–15.5)
Smear Review: NORMAL
WBC: 4.6 K/uL (ref 4.0–10.5)
nRBC: 0 % (ref 0.0–0.2)

## 2024-07-28 LAB — APTT: aPTT: 36 s (ref 24–36)

## 2024-07-28 LAB — PROTIME-INR
INR: 1 (ref 0.8–1.2)
Prothrombin Time: 14 s (ref 11.4–15.2)

## 2024-07-28 LAB — TYPE AND SCREEN
ABO/RH(D): O POS
Antibody Screen: NEGATIVE

## 2024-07-28 LAB — LIPASE, BLOOD: Lipase: 65 U/L — ABNORMAL HIGH (ref 11–51)

## 2024-07-28 MED ORDER — IOHEXOL 300 MG/ML  SOLN
100.0000 mL | Freq: Once | INTRAMUSCULAR | Status: AC | PRN
  Administered 2024-07-28: 100 mL via INTRAVENOUS

## 2024-07-28 NOTE — ED Triage Notes (Signed)
 Brought from [facility]. Rectal bleeding X1 month

## 2024-07-28 NOTE — ED Triage Notes (Signed)
 Patient presents with rectal bleeding X1 month. Reports burning and itching. Reports blood is bright red

## 2024-07-28 NOTE — ED Provider Notes (Signed)
 "  [facility] Provider Note    Event Date/Time   First MD Initiated Contact with Patient 07/28/24 1133     (approximate)   History   Rectal Bleeding   HPI  Jimmy Ardoin Sr. is a 64 y.o. 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  He presents today as after a bowel movement he had worsening increase in the amount of blood.  Denies any lightheadedness or syncopal episodes.  Denies any chest pain or shortness of breath.  He has never had a colonoscopy.  He reports some mild left abdominal pain which is intermittent but none currently.  He presents with his son who contributes to the history     Physical Exam   Triage Vital Signs: ED Triage Vitals  Encounter Vitals Group     BP 07/28/24 1127 (!) 182/89     Girls Systolic BP Percentile --      Girls Diastolic BP Percentile --      Boys Systolic BP Percentile --      Boys Diastolic BP Percentile --      Pulse Rate 07/28/24 1127 71     Resp 07/28/24 1127 15     Temp 07/28/24 1127 (!) 97.5 F (36.4 C)     Temp Source 07/28/24 1127 Oral     SpO2 07/28/24 1127 96 %     Weight 07/28/24 1126 206 lb (93.4 kg)     Height 07/28/24 1126 5' 7 (1.702 m)     Head Circumference --      Peak Flow --      Pain Score 07/28/24 1126 6     Pain Loc --      Pain Education --      Exclude from Growth Chart --     Most recent vital signs: Vitals:   07/28/24 1127 07/28/24 1214  BP: (!) 182/89   Pulse: 71   Resp: 15   Temp: (!) 97.5 F (36.4 C)   SpO2: 96% 96%    Nursing Triage Note reviewed. Vital signs reviewed and patients oxygen saturation is normoxic  General: Patient is well nourished, well developed, awake and alert, resting comfortably in no acute distress Head: Normocephalic and  atraumatic Eyes: Normal inspection, extraocular muscles intact, no conjunctival pallor Ear, nose, throat: Normal external exam Neck: Normal range of motion Respiratory: Patient is in no respiratory distress, lungs CTAB Cardiovascular: Patient is not tachycardic, RRR without murmur appreciated GI: Abd soft, very mild tenderness to palpation in the left lower quadrant with no guarding or rebound   Rectal exam: Completed with paramedic Brandi in the room as chaperone There was a external large hemorrhoid but not actively thrombosed and not rectally bleeding, there is retained tissue paper, there was no gross blood on rectal exam  Back: Normal inspection of the back with good strength and range of motion throughout all ext Extremities: pulses intact with good cap refills, no LE pitting edema or calf tenderness Neuro: The patient is alert and oriented to person, place, and time, appropriately conversive, with 5/5 bilat UE/LE strength, no gross motor or sensory defects noted. Coordination appears to be adequate. Skin: Warm, dry, and intact Psych: normal mood and affect, no SI or HI  ED Results / Procedures / Treatments  Labs (all labs ordered are listed, but only abnormal results are displayed) Labs Reviewed  CBC WITH DIFFERENTIAL/PLATELET - Abnormal; Notable for the following components:      Result Value   Neutro Abs 1.2 (*)    All other components within normal limits  COMPREHENSIVE METABOLIC PANEL WITH GFR - Abnormal; Notable for the following components:   Total Protein 8.2 (*)    AST 52 (*)    All other components within normal limits  LIPASE, BLOOD - Abnormal; Notable for the following components:   Lipase 65 (*)    All other components within normal limits  PROTIME-INR  APTT  TYPE AND SCREEN     EKG None  RADIOLOGY CT abd and pelvis with contrast: No acute abnormality on my independent review interpretation radiologist agrees    PROCEDURES:  Critical Care  performed: No  Procedures   MEDICATIONS ORDERED IN ED: Medications  iohexol  (OMNIPAQUE ) 300 MG/ML solution 100 mL (100 mLs Intravenous Contrast Given 07/28/24 1352)     IMPRESSION / MDM / ASSESSMENT AND PLAN / ED COURSE                                Differential diagnosis includes, but is not limited to: External hemorrhoids, internal hemorrhoids, diverticulitis, colitis, anemia, bowel malignancy   ED course: Patient appears well-perfused and is not hypotensive.  He is not anemic via his blood counts and has no coagulation abnormalities.  His BUNs/creatinine ratio was not elevated and not indicative of a profound GI bleed.  His lipase is only very mildly elevated however patient is nontender in his epigastrium I suspect this is artifact.  On rectal exam he does have an external hemorrhoid and I do wonder whether hemorrhoids are contributing to his presentation.  Given his left tenderness to palpation will obtain a CT abdomen pelvis to evaluate for any causes that may require antibiotics such as diverticulitis.  However if this is unremarkable patient can likely return home with gastroenterology follow-up as patient was told that I cannot rule out any small malignancies based on the workup done today.  He and his son voiced understanding and agreement with this plan   Clinical Course as of 07/28/24 1655  Thu Jul 28, 2024  1202 Patient does have a nonthrombosed external hemorrhoid which is large but no gross blood on rectal exam [HD]  1343 CBC with Differential(!) No profound anemia [HD]  1344 Protime-INR Not elevated [HD]  1344 APTT Not elevated [HD]  1344 Comprehensive metabolic panel(!) No profound electrolyte derangements, BUN/creatinine ratio not elevated [HD]  1344 Lipase, blood(!) Slightly elevated however patient is nontender in this region [HD]  1455 Repeat abdominal exam benign.  Patient knows that he needs to call gastroenterology to discuss colonoscopy.  Will place a  primary care physician referral.  Discussed importance of sitz bath's and high-fiber diet [HD]    Clinical Course User Index [HD] Nicholaus Rolland BRAVO, MD   At time of discharge there is no evidence of acute life, limb, vision, or fertility threat. Patient has stable vital signs, pain is well controlled, patient is ambulatory and p.o. tolerant.  Discharge instructions were completed using the EPIC system. I would refer you to those at this time. All warnings prescriptions follow-up etc. were discussed in detail with the patient. Patient indicates understanding and is agreeable with this plan. All questions answered.  Patient is made aware that they may return to the  emergency department for any worsening or new condition or for any other emergency.   -- Risk: 5 This patient has a high risk of morbidity due to further diagnostic testing or treatment. Rationale: This patients evaluation and management involve a high risk of morbidity due to the potential severity of presenting symptoms, need for diagnostic testing, and/or initiation of treatment that may require close monitoring. The differential includes conditions with potential for significant deterioration or requiring escalation of care. Treatment decisions in the ED, including medication administration, procedural interventions, or disposition planning, reflect this level of risk. COPA: 5 The patient has the following acute or chronic illness/injury that poses a possible threat to life or bodily function: [X] : The patient has a potentially serious acute condition or an acute exacerbation of a chronic illness requiring urgent evaluation and management in the Emergency Department. The clinical presentation necessitates immediate consideration of life-threatening or function-threatening diagnoses, even if they are ultimately ruled out.   FINAL CLINICAL IMPRESSION(S) / ED DIAGNOSES   Final diagnoses:  Lower GI bleed  Left lateral abdominal pain      Rx / DC Orders   ED Discharge Orders          Ordered    Ambulatory Referral to Primary Care (Establish Care)        07/28/24 1455             Note:  This document was prepared using Dragon voice recognition software and may include unintentional dictation errors.   Nicholaus Rolland BRAVO, MD 07/28/24 1655  "

## 2024-07-28 NOTE — Discharge Instructions (Signed)
 You was seen for bright red blood per rectum.  Workup today was reassuring but that does not mean that nothing is wrong but rather you safe to continue the workup as an outpatient.  Please stick to a high-fiber diet and do sitz bath's twice daily for the next 2 weeks.  Called gastroenterology tomorrow to arrange an appointment to discuss colonoscopy as I cannot rule out a malignancy.  Return with any acutely worsening symptoms or any other emergency. -- RETURN PRECAUTIONS & AFTERCARE: (ENGLISH) RETURN PRECAUTIONS: Return immediately to the emergency department or see/call your doctor if you feel worse, weak or have changes in speech or vision, are short of breath, have fever, vomiting, pain, bleeding or dark stool, trouble urinating or any new issues. Return here or see/call your doctor if not improving as expected for your suspected condition. FOLLOW-UP CARE: Call your doctor and/or any doctors we referred you to for more advice and to make an appointment. Do this today, tomorrow or after the weekend. Some doctors only take PPO insurance so if you have HMO insurance you may want to contact your HMO or your regular doctor for referral to a specialist within your plan. Either way tell the doctor's office that it was a referral from the emergency department so you get the soonest possible appointment.  YOUR TEST RESULTS: Take result reports of any blood or urine tests, imaging tests and EKG's to your doctor and any referral doctor. Have any abnormal tests repeated. Your doctor or a referral doctor can let you know when this should be done. Also make sure your doctor contacts this hospital to get any test results that are not currently available such as cultures or special tests for infection and final imaging reports, which are often not available at the time you leave the ER but which may list additional important findings that are not documented on the preliminary report. BLOOD PRESSURE: If your blood pressure  was greater than 120/80 have your blood pressure rechecked within 1 to 2 weeks. MEDICATION SIDE EFFECTS: Do not drive, walk, bike, take the bus, etc. if you have received or are being prescribed any sedating medications such as those for pain or anxiety or certain antihistamines like Benadryl. If you have been give one of these here get a taxi home or have a friend drive you home. Ask your pharmacist to counsel you on potential side effects of any new medication
